# Patient Record
Sex: Female | Born: 1980 | Race: Black or African American | Hispanic: No | Marital: Single | Smoking: Never smoker
Health system: Southern US, Community
[De-identification: ages and names within clinical notes are randomized; demographics above are authoritative.]

## PROBLEM LIST (undated history)

## (undated) DIAGNOSIS — E559 Vitamin D deficiency, unspecified: Secondary | ICD-10-CM

## (undated) DIAGNOSIS — M25569 Pain in unspecified knee: Secondary | ICD-10-CM

## (undated) DIAGNOSIS — F32A Depression, unspecified: Secondary | ICD-10-CM

## (undated) DIAGNOSIS — F329 Major depressive disorder, single episode, unspecified: Secondary | ICD-10-CM

## (undated) DIAGNOSIS — F419 Anxiety disorder, unspecified: Secondary | ICD-10-CM

## (undated) HISTORY — DX: Depression, unspecified: F32.A

## (undated) HISTORY — DX: Major depressive disorder, single episode, unspecified: F32.9

## (undated) HISTORY — DX: Morbid (severe) obesity due to excess calories: E66.01

## (undated) HISTORY — DX: Anxiety disorder, unspecified: F41.9

## (undated) HISTORY — DX: Pain in unspecified knee: M25.569

## (undated) HISTORY — DX: Vitamin D deficiency, unspecified: E55.9

---

## 2004-10-02 ENCOUNTER — Emergency Department: Payer: Self-pay | Admitting: Emergency Medicine

## 2007-12-04 ENCOUNTER — Ambulatory Visit: Payer: Self-pay | Admitting: Family Medicine

## 2008-06-22 ENCOUNTER — Emergency Department: Payer: Self-pay | Admitting: Emergency Medicine

## 2008-12-30 ENCOUNTER — Ambulatory Visit: Payer: Self-pay | Admitting: Family Medicine

## 2009-03-21 ENCOUNTER — Ambulatory Visit: Payer: Self-pay | Admitting: Urology

## 2009-04-20 ENCOUNTER — Ambulatory Visit: Payer: Self-pay | Admitting: Family Medicine

## 2009-06-08 ENCOUNTER — Ambulatory Visit: Payer: Self-pay | Admitting: Family Medicine

## 2009-06-15 ENCOUNTER — Encounter: Payer: Self-pay | Admitting: Family Medicine

## 2009-07-02 ENCOUNTER — Encounter: Payer: Self-pay | Admitting: Family Medicine

## 2009-08-02 ENCOUNTER — Encounter: Payer: Self-pay | Admitting: Family Medicine

## 2009-12-13 DIAGNOSIS — R319 Hematuria, unspecified: Secondary | ICD-10-CM

## 2010-05-21 ENCOUNTER — Emergency Department: Payer: Self-pay | Admitting: Emergency Medicine

## 2010-05-26 IMAGING — CR DG KNEE 1-2V*R*
1 series · 2 of 2 positions shown · non-contrast
Comparison: none

REASON FOR EXAM: pain/weight bearing
COMMENTS:

PROCEDURE:     KDR - KDXR KNEE RIGHT AP AND LATERAL  - April 20, 2009  [DATE]
RESULT:     No fracture, dislocation or other acute bony abnormality is
identified. The knee joint space is well maintained. The patella is intact.

[Series 2: view not recorded · 0.17mm/px · 2 of 2 slices shown]
[im 1/2]
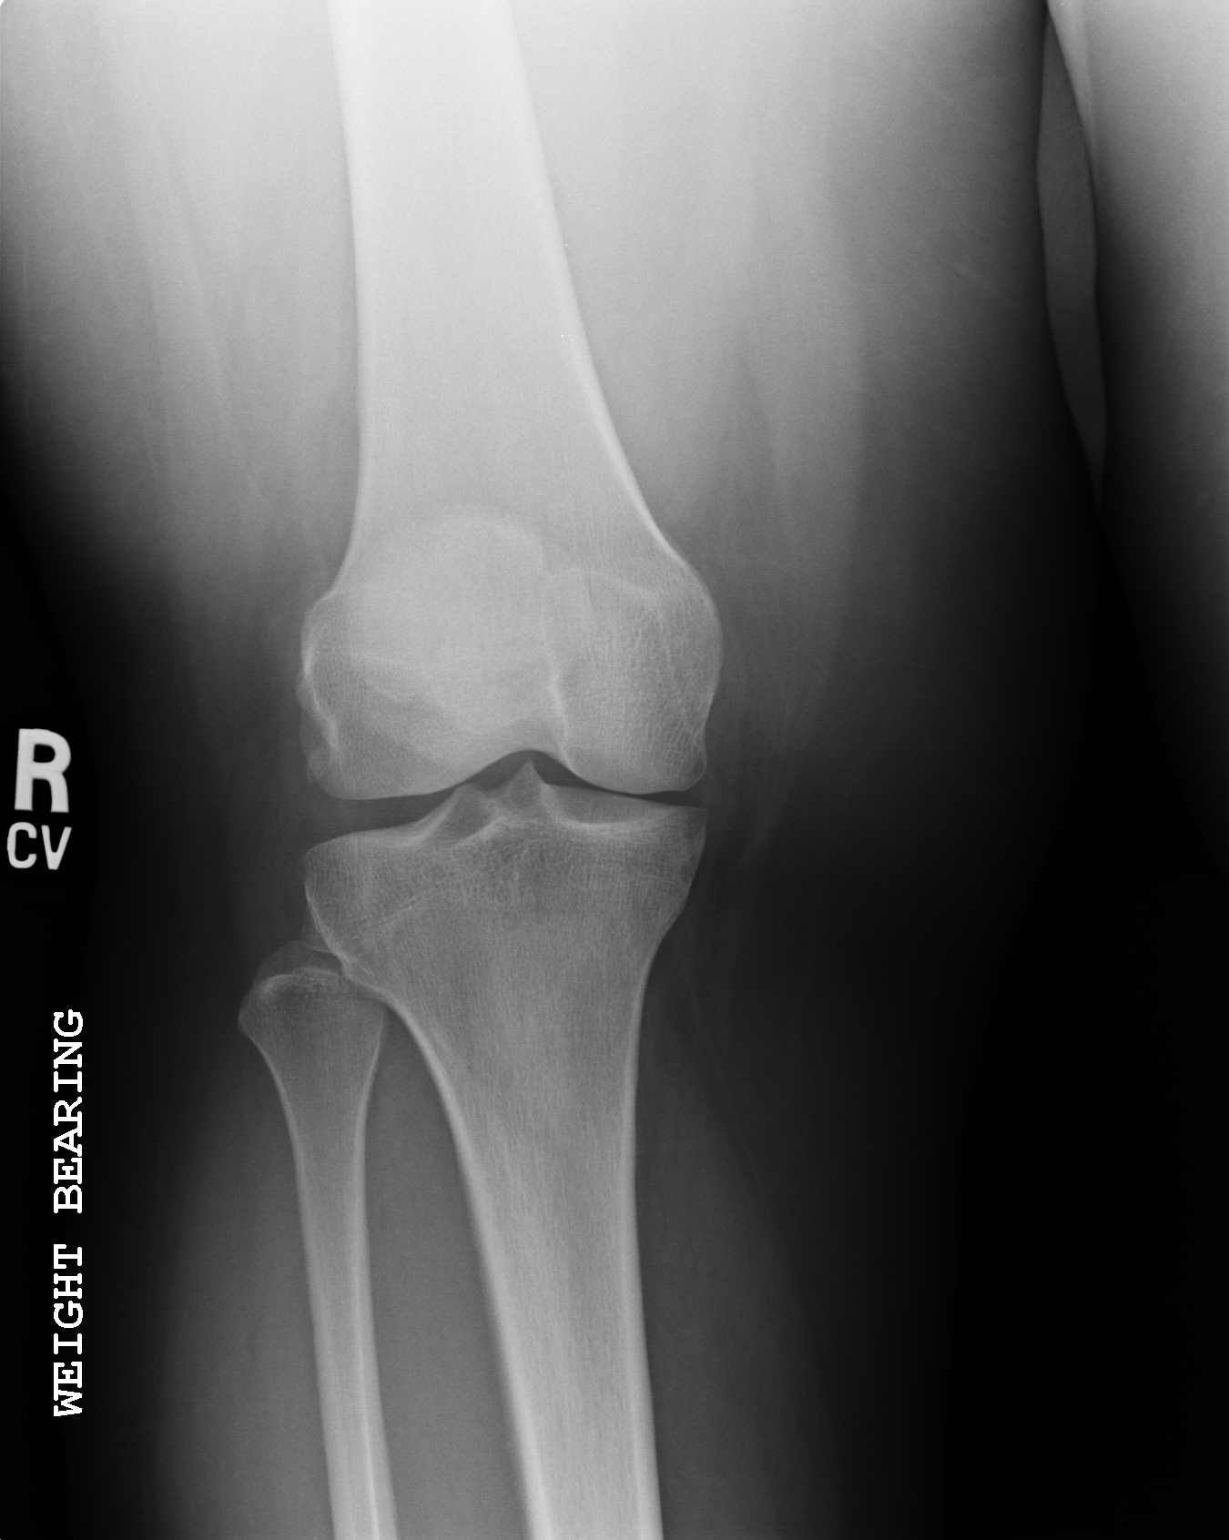
[im 2/2]
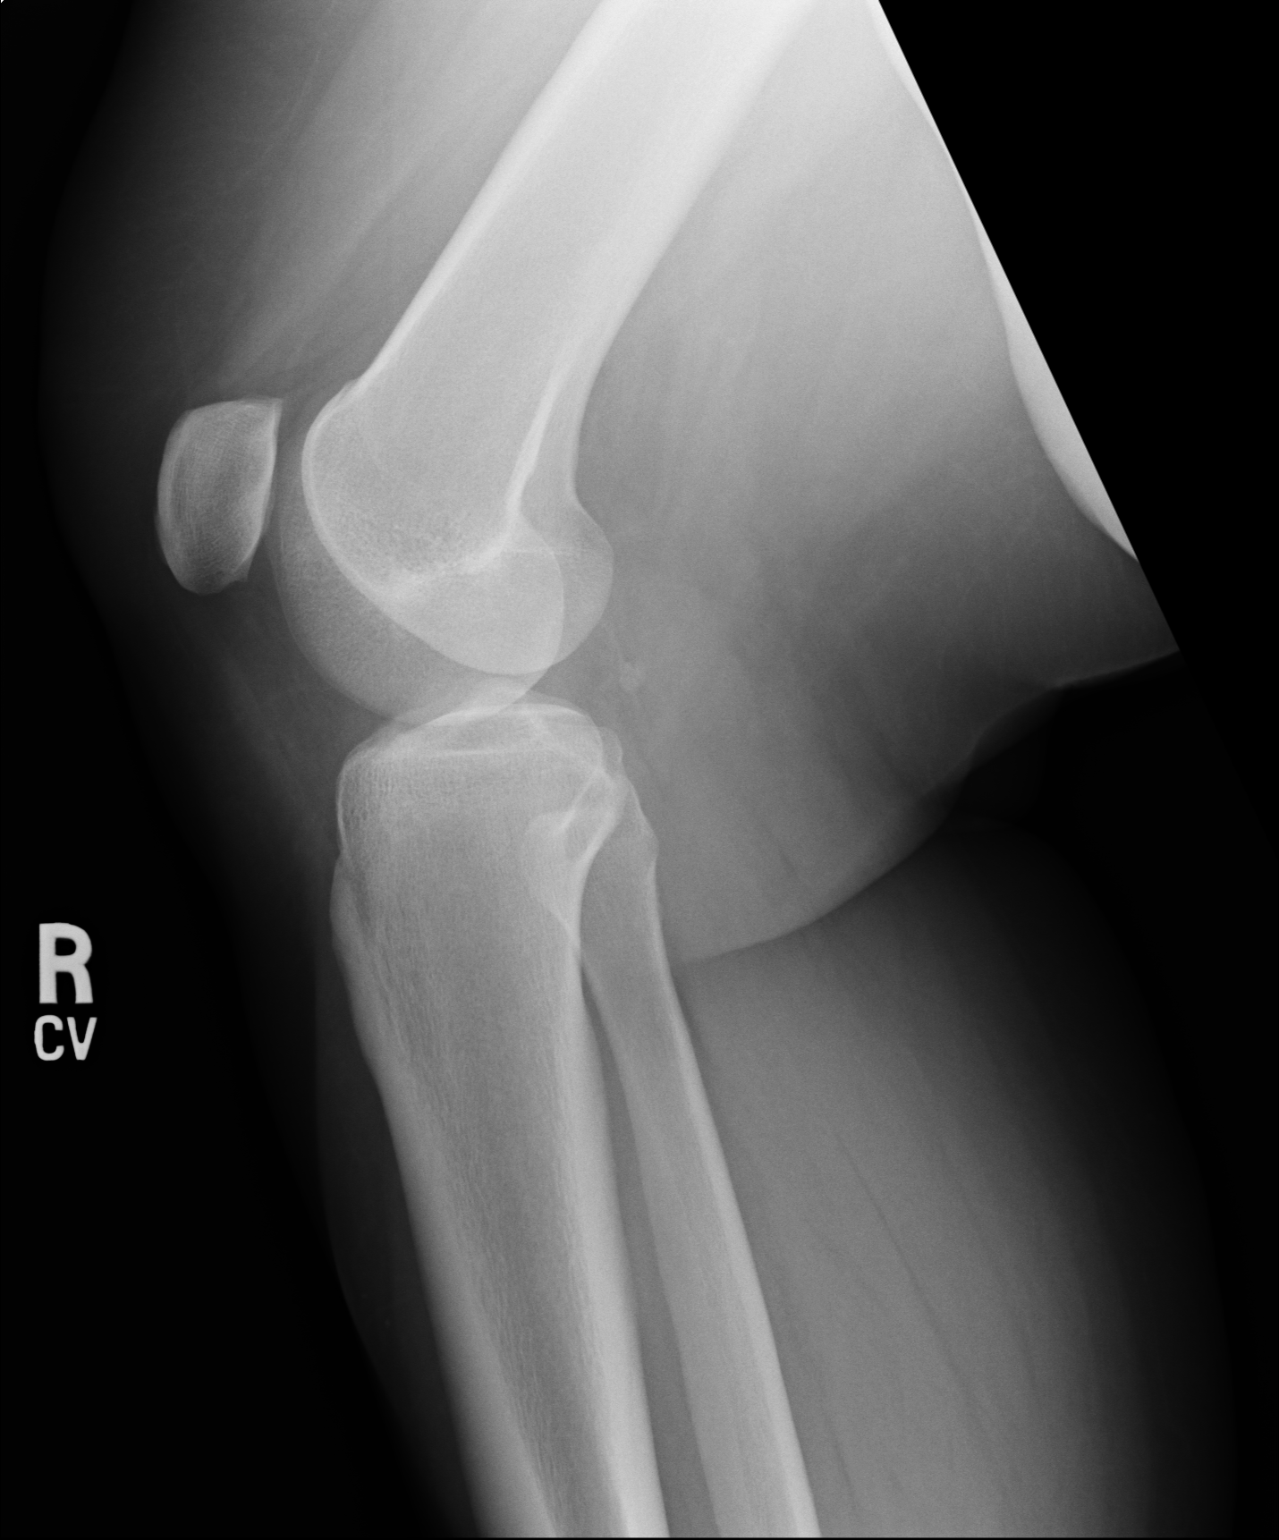

[2 of 2 positions shown; findings below may reference images not displayed]

IMPRESSION: No acute changes are identified.

## 2010-07-14 IMAGING — US TRANSABDOMINAL ULTRASOUND OF PELVIS
1 series · 17 of 25 positions shown · non-contrast
Comparison: none

REASON FOR EXAM: suprapubic pelvic pain
COMMENTS:

[Series 1: transabdominal ultrasound of pelvis · 17 of 59 slices shown]
[im 1/59]
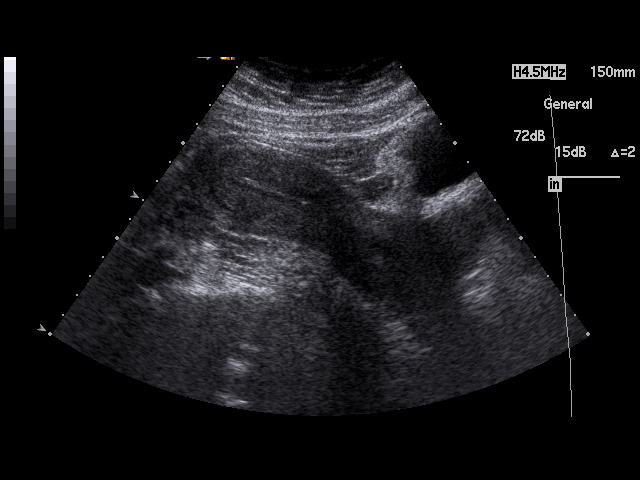
[im 5/59]
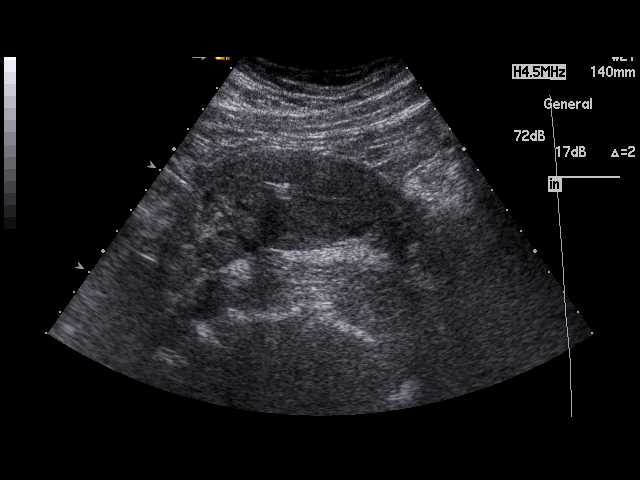
[im 8/59]
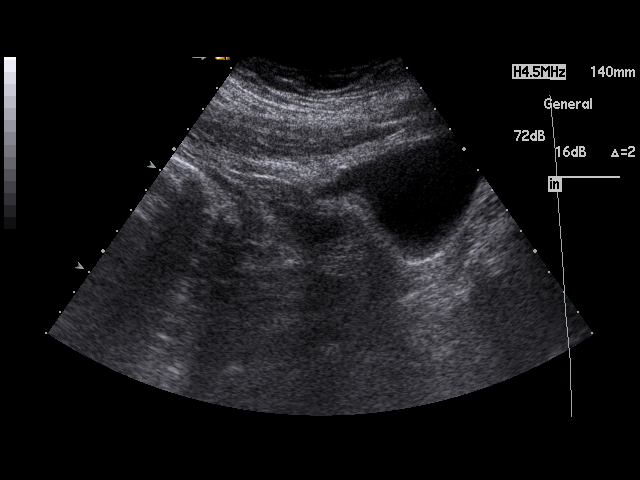
[im 13/59]
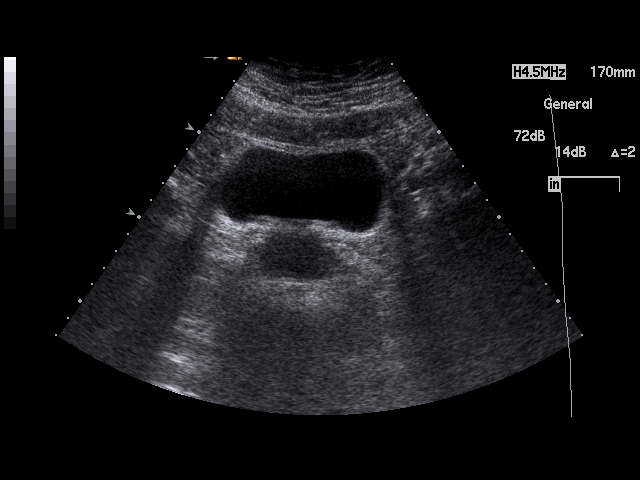
[im 15/59]
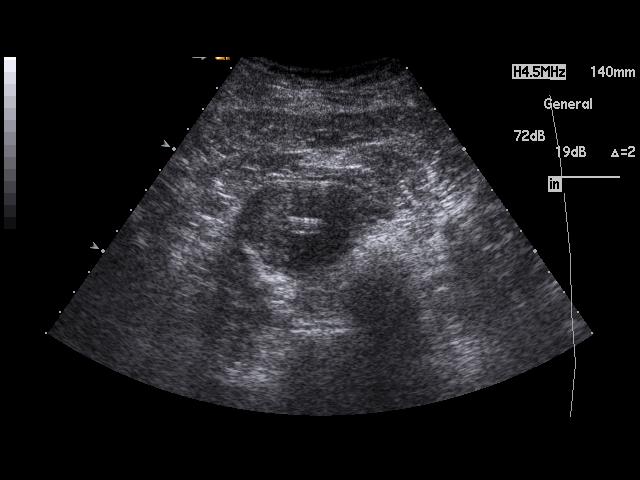
[im 20/59]
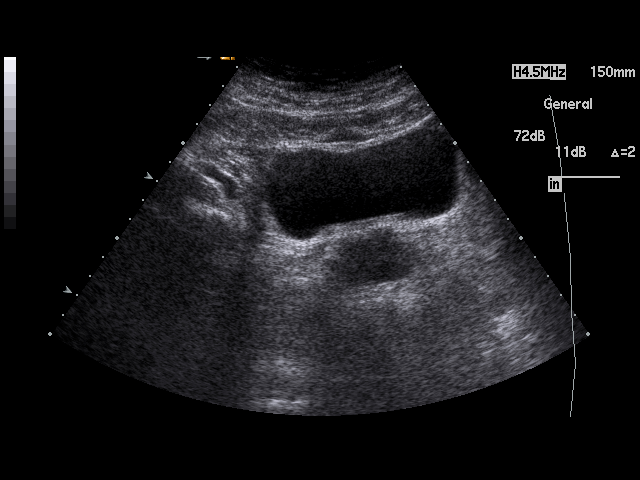
[im 22/59]
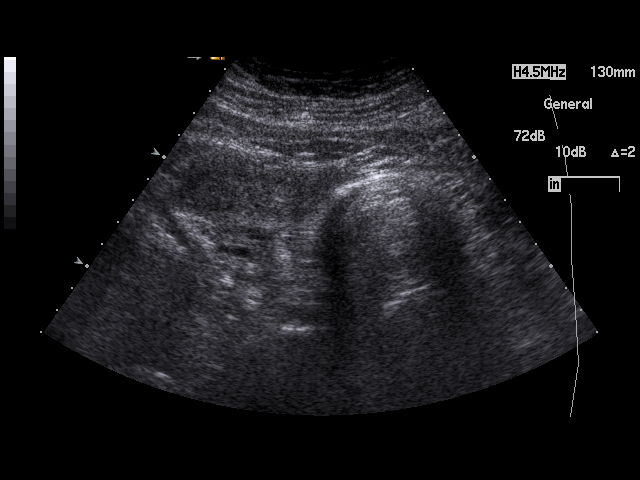
[im 27/59]
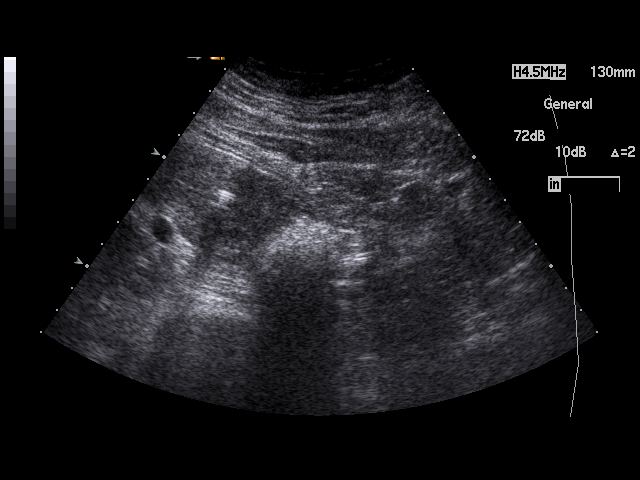
[im 30/59]
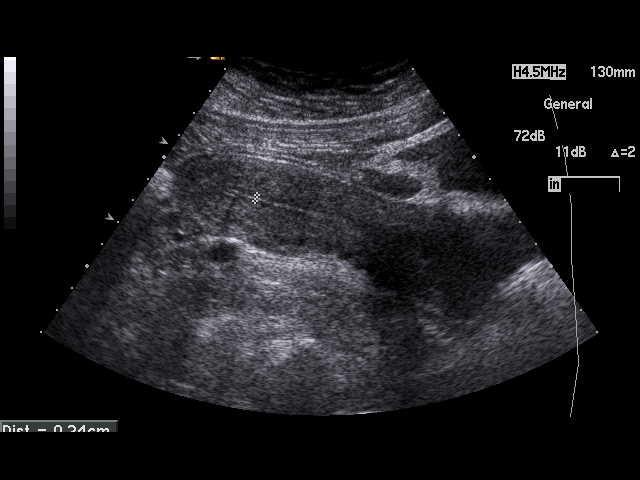
[im 32/59]
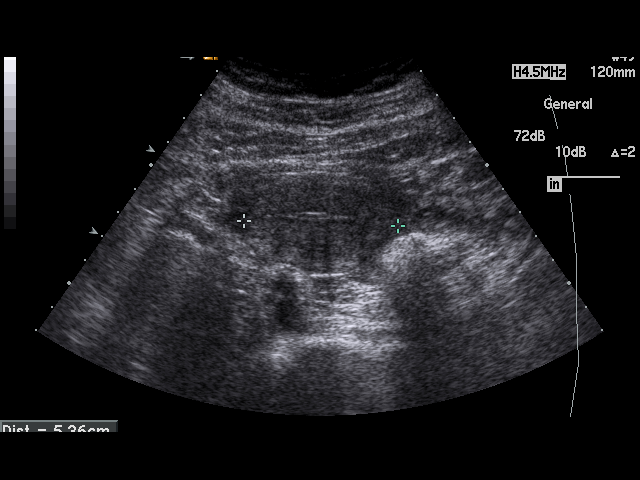
[im 37/59]
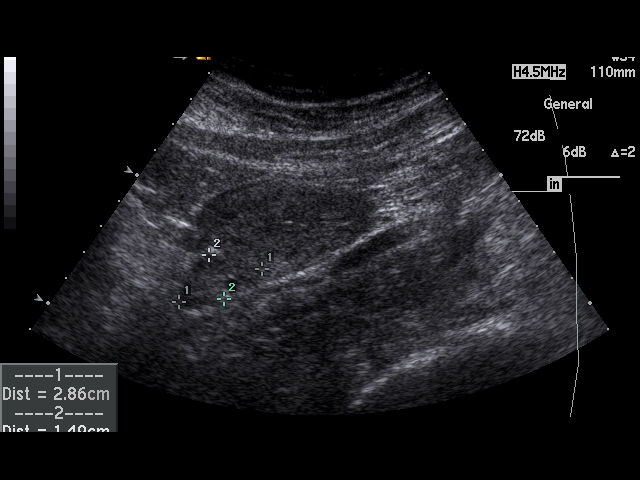
[im 39/59]
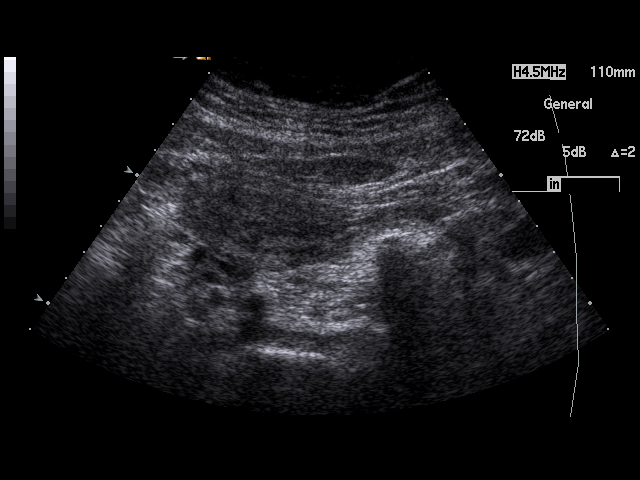
[im 44/59]
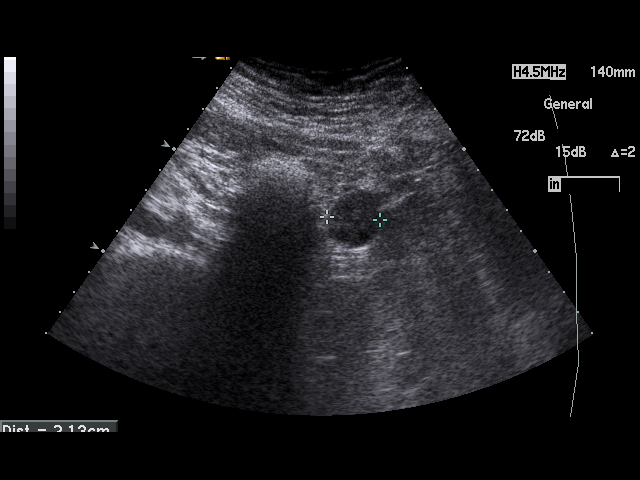
[im 46/59]
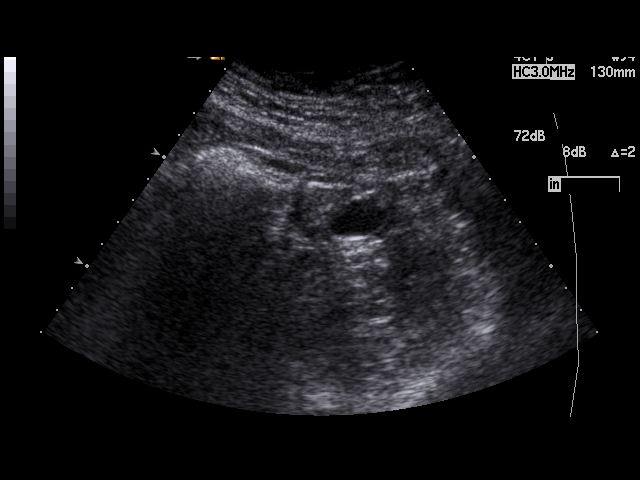
[im 51/59]
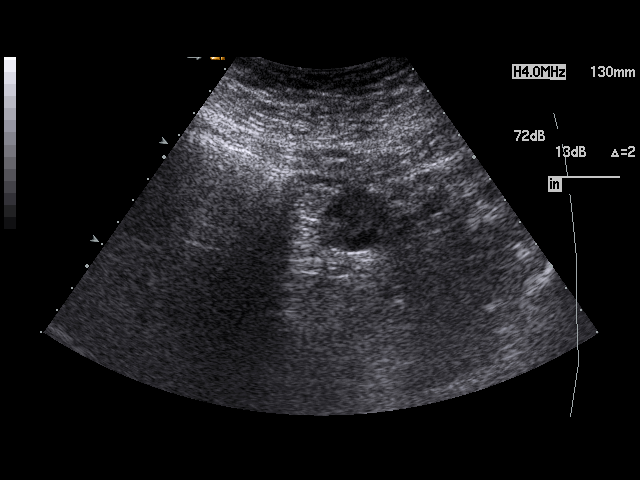
[im 54/59]
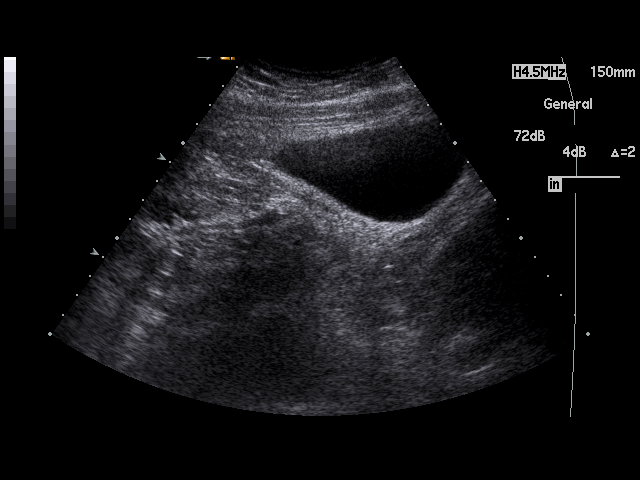
[im 59/59]
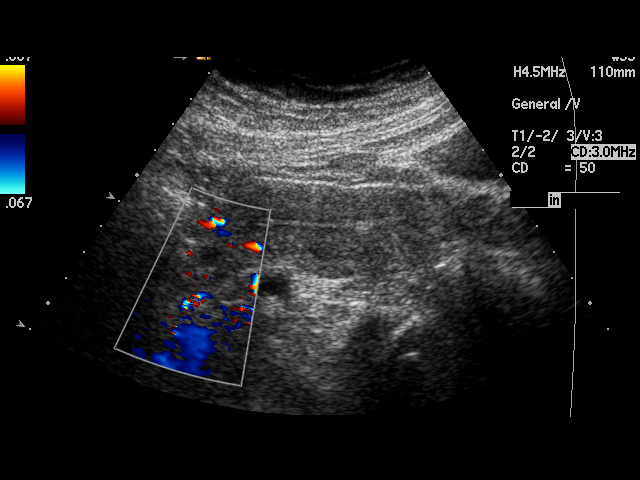

[17 of 25 positions shown; findings below may reference images not displayed]

PROCEDURE:     US  - US PELVIS EXAM  - June 08, 2009  [DATE]

RESULT:     Transabdominal pelvic ultrasound was performed. The uterus
measures 13.22 cm x 5.36 cm x 3.49 cm. No uterine mass lesions are seen. The
endometrium measures 2.4 mm in thickness. An IUD is present within the
endometrial cavity and appears to be in good position. The right and left
ovaries are visualized. The right ovary measures 2.86 cm at maximum diameter
and the left ovary measures 4.46 cm at maximum diameter. There is a 2.47 cm
cyst of the left ovary. No free fluid is identified in the pelvis. The
visualized portion of the urinary bladder is normal in appearance.
IMPRESSION: 1. There is simple cyst to the left ovary. Otherwise normal study.
2. An IUD is present.

## 2010-07-14 IMAGING — US ABDOMEN ULTRASOUND
1 series · 17 of 25 positions shown · non-contrast
Comparison: none

REASON FOR EXAM: RUQ
COMMENTS:

[Series 1: abdomen ultrasound · 17 of 54 slices shown]
[im 1/54]
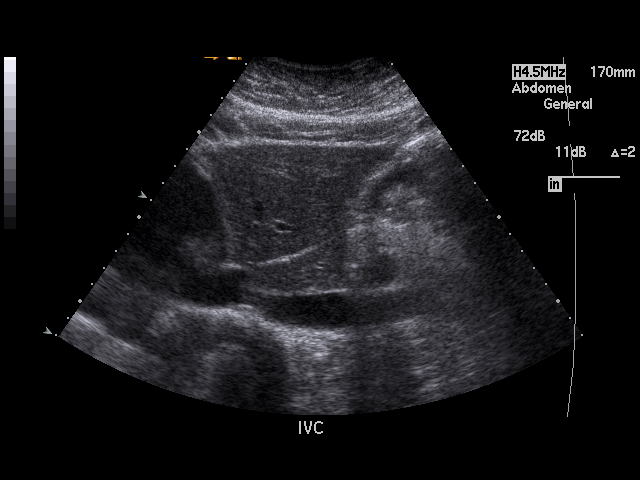
[im 5/54]
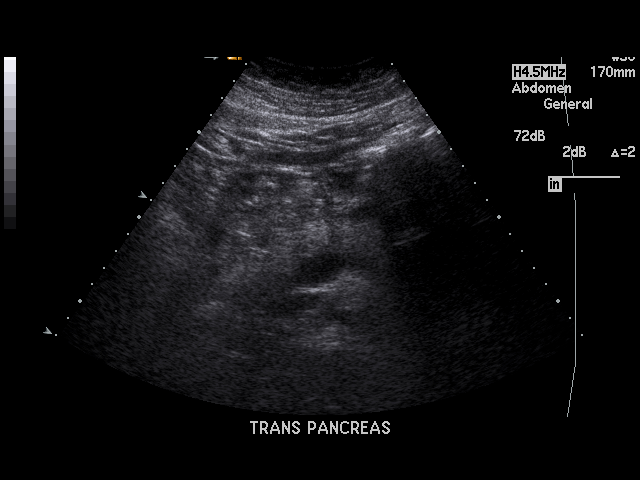
[im 7/54]
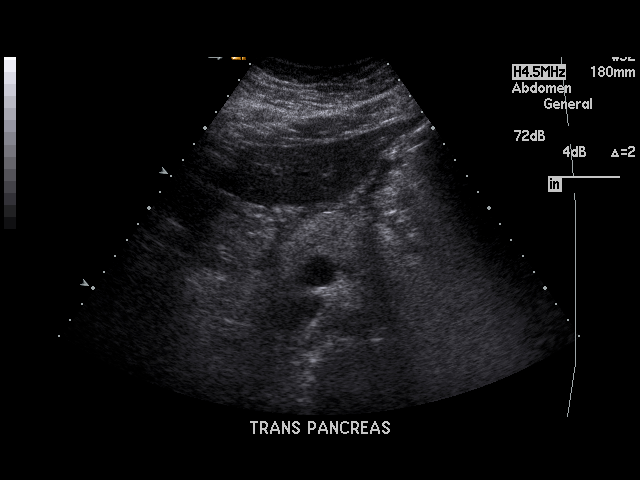
[im 12/54]
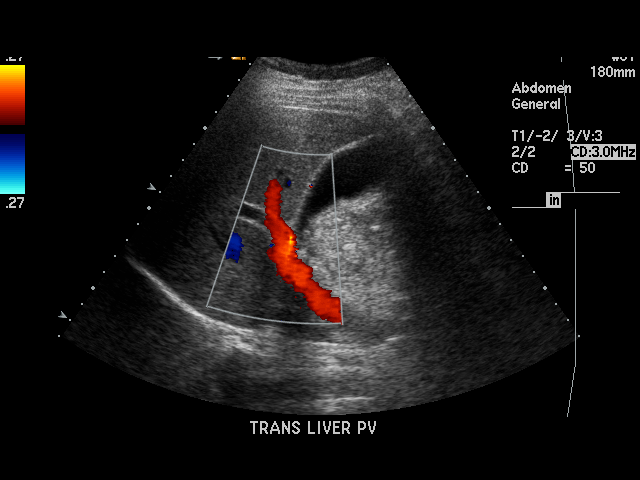
[im 14/54]
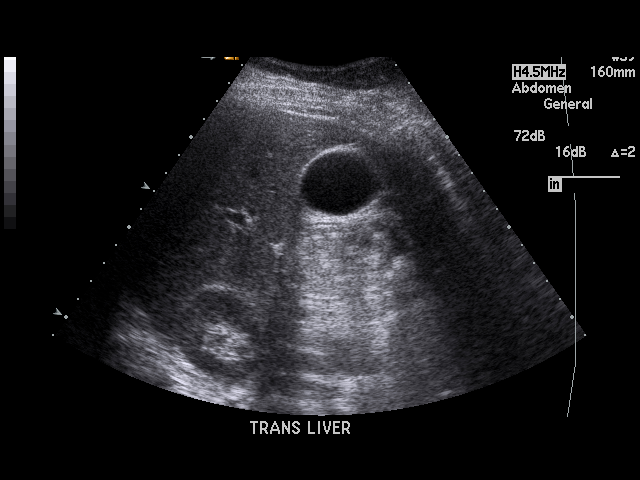
[im 18/54]
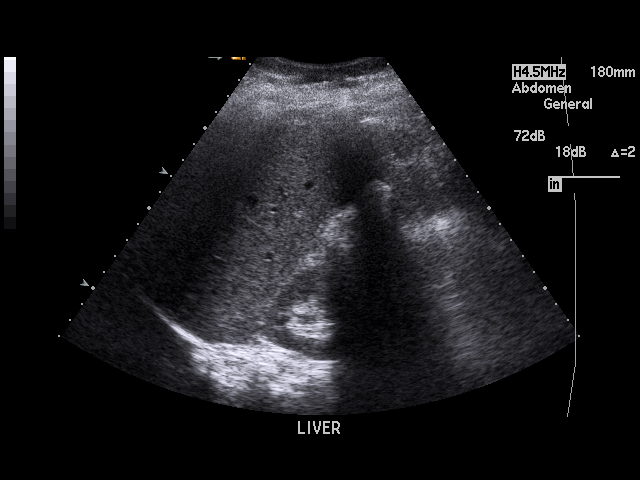
[im 20/54]
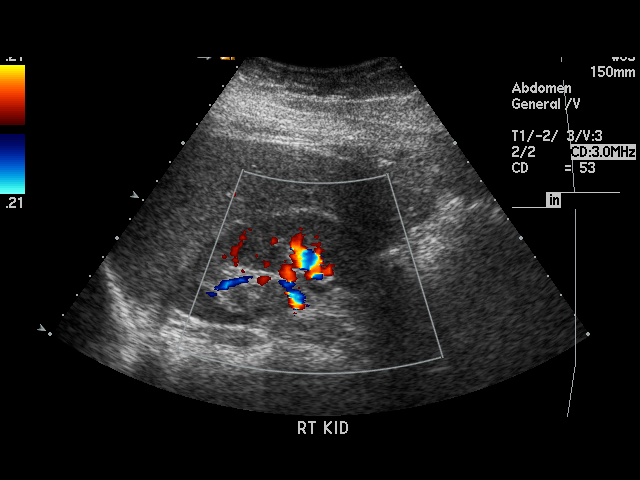
[im 25/54]
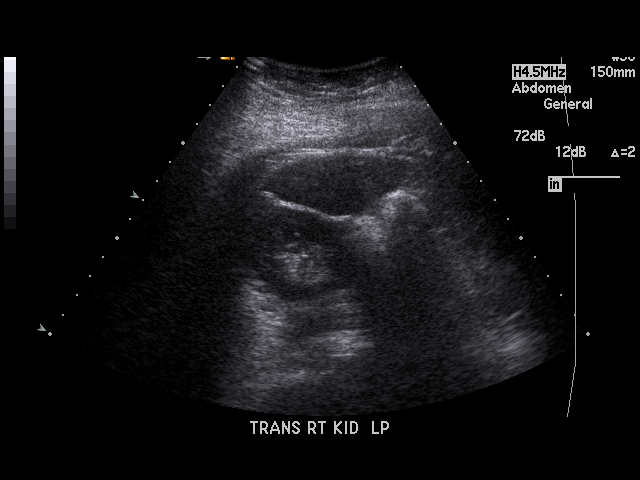
[im 27/54]
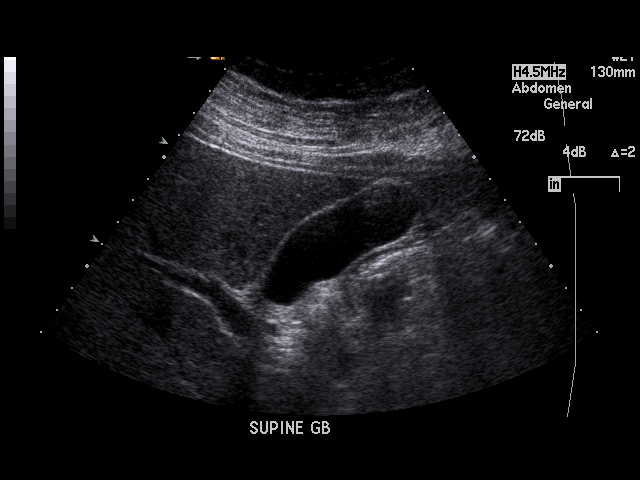
[im 29/54]
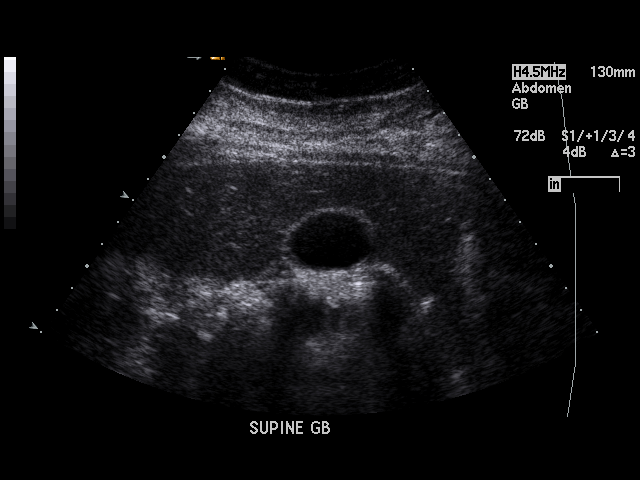
[im 34/54]
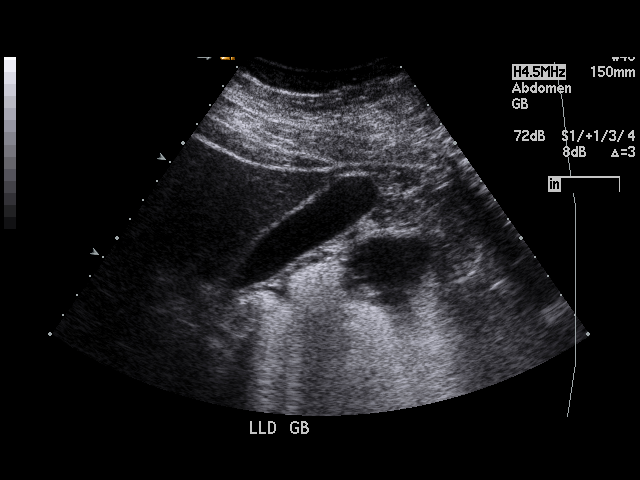
[im 36/54]
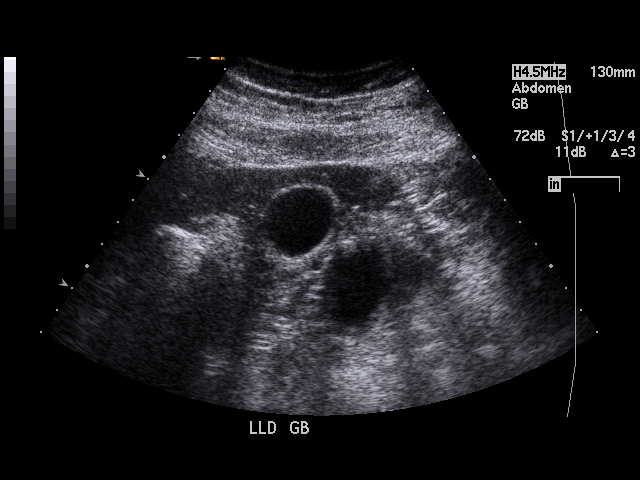
[im 40/54]
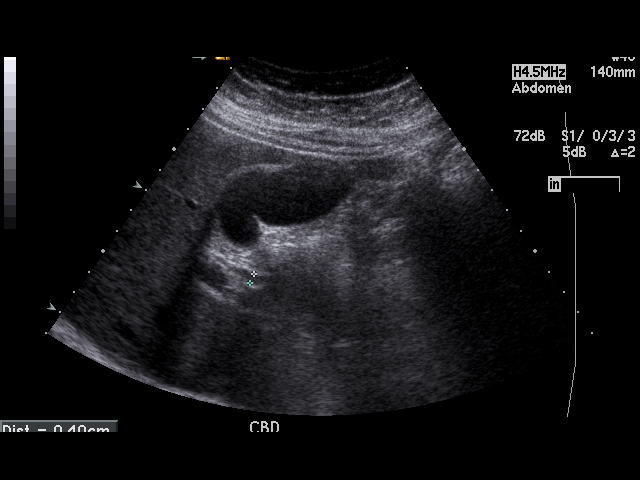
[im 42/54]
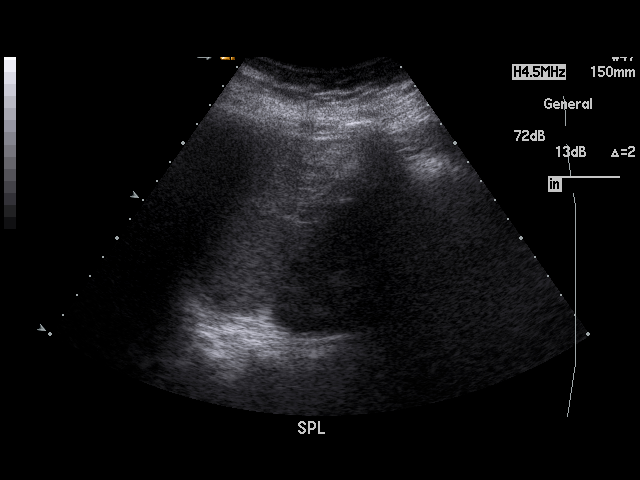
[im 47/54]
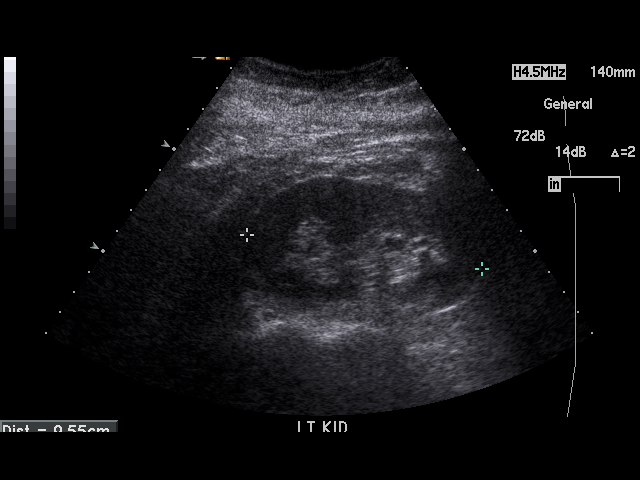
[im 49/54]
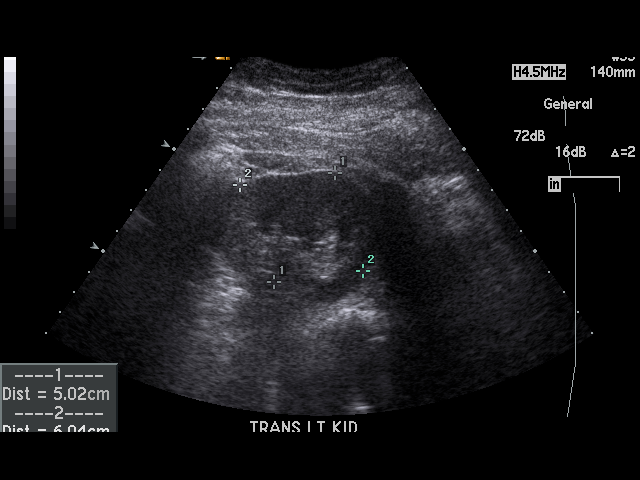
[im 54/54]
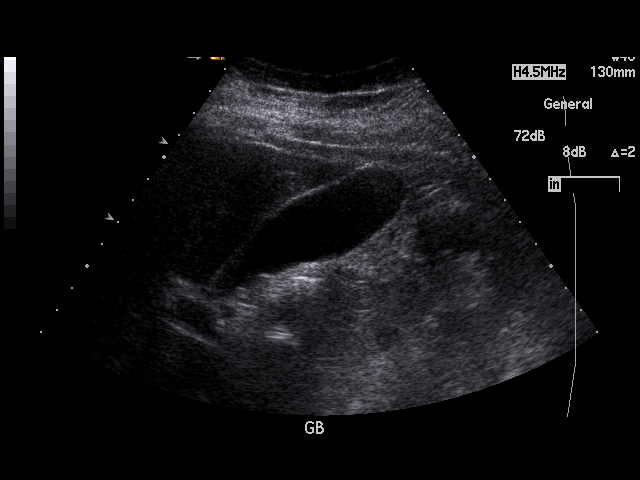

[17 of 25 positions shown; findings below may reference images not displayed]

PROCEDURE:     US  - US ABDOMEN GENERAL SURVEY  - June 08, 2009  [DATE]

RESULT:     The body of the pancreas is normal in appearance. The pancreatic
tail and head are partially obscured by bowel gas. The liver, spleen,
abdominal aorta and inferior vena cava show no significant abnormalities. No
gallstones are seen. There is no thickening of the gallbladder wall. The
common bile duct measures 4 mm in diameter which is within normal limits.
The kidneys show no hydronephrosis. There is no ascites.
IMPRESSION: 1. No significant abnormalities are noted.
2. The pancreas is partially obscured by bowel gas.

## 2014-08-22 ENCOUNTER — Encounter: Payer: Self-pay | Admitting: Family Medicine

## 2014-08-22 DIAGNOSIS — B009 Herpesviral infection, unspecified: Secondary | ICD-10-CM | POA: Insufficient documentation

## 2014-08-22 DIAGNOSIS — M549 Dorsalgia, unspecified: Secondary | ICD-10-CM

## 2014-08-22 DIAGNOSIS — F339 Major depressive disorder, recurrent, unspecified: Secondary | ICD-10-CM | POA: Insufficient documentation

## 2014-08-22 DIAGNOSIS — G4700 Insomnia, unspecified: Secondary | ICD-10-CM | POA: Insufficient documentation

## 2014-08-22 DIAGNOSIS — E668 Other obesity: Secondary | ICD-10-CM | POA: Insufficient documentation

## 2014-08-22 DIAGNOSIS — J309 Allergic rhinitis, unspecified: Secondary | ICD-10-CM | POA: Insufficient documentation

## 2014-08-22 DIAGNOSIS — K219 Gastro-esophageal reflux disease without esophagitis: Secondary | ICD-10-CM | POA: Insufficient documentation

## 2014-08-22 DIAGNOSIS — K5900 Constipation, unspecified: Secondary | ICD-10-CM | POA: Insufficient documentation

## 2014-08-22 DIAGNOSIS — G8929 Other chronic pain: Secondary | ICD-10-CM | POA: Insufficient documentation

## 2014-08-22 DIAGNOSIS — M25569 Pain in unspecified knee: Secondary | ICD-10-CM | POA: Insufficient documentation

## 2014-08-22 DIAGNOSIS — Z975 Presence of (intrauterine) contraceptive device: Secondary | ICD-10-CM | POA: Insufficient documentation

## 2014-08-25 ENCOUNTER — Encounter (INDEPENDENT_AMBULATORY_CARE_PROVIDER_SITE_OTHER): Payer: Self-pay

## 2014-08-25 ENCOUNTER — Ambulatory Visit (INDEPENDENT_AMBULATORY_CARE_PROVIDER_SITE_OTHER): Payer: BLUE CROSS/BLUE SHIELD | Admitting: Family Medicine

## 2014-08-25 ENCOUNTER — Encounter: Payer: Self-pay | Admitting: Family Medicine

## 2014-08-25 VITALS — BP 100/62 | HR 92 | Temp 98.3°F | Resp 18 | Ht 65.0 in | Wt 278.0 lb

## 2014-08-25 DIAGNOSIS — M545 Low back pain, unspecified: Secondary | ICD-10-CM

## 2014-08-25 DIAGNOSIS — G4700 Insomnia, unspecified: Secondary | ICD-10-CM | POA: Diagnosis not present

## 2014-08-25 DIAGNOSIS — F339 Major depressive disorder, recurrent, unspecified: Secondary | ICD-10-CM | POA: Diagnosis not present

## 2014-08-25 DIAGNOSIS — E668 Other obesity: Secondary | ICD-10-CM

## 2014-08-25 DIAGNOSIS — E669 Obesity, unspecified: Secondary | ICD-10-CM

## 2014-08-25 MED ORDER — SERTRALINE HCL 50 MG PO TABS: 100.0000 mg | ORAL_TABLET | ORAL | Status: DC

## 2014-08-25 MED ORDER — QUETIAPINE FUMARATE 25 MG PO TABS: 25.0000 mg | ORAL_TABLET | ORAL | Status: DC

## 2014-08-25 MED ORDER — ZOLPIDEM TARTRATE ER 12.5 MG PO TBCR: 12.5000 mg | EXTENDED_RELEASE_TABLET | Freq: Every day | ORAL | Status: DC

## 2014-08-25 MED ORDER — METAXALONE 800 MG PO TABS: 800.0000 mg | ORAL_TABLET | Freq: Three times a day (TID) | ORAL | Status: DC

## 2014-08-25 MED ORDER — BUPROPION HCL ER (XL) 150 MG PO TB24: 150.0000 mg | ORAL_TABLET | Freq: Every day | ORAL | Status: DC

## 2014-08-25 NOTE — Patient Instructions (Signed)
    GET HELP  Contact a suicide hotline, crisis center, or local suicide prevention center for help right away. Local centers may include a hospital, clinic, community service organization, social service provider, or health department.  Call your local emergency services (911 in the [place]).  Call a suicide hotline:  1-800-273-TALK (1-800-273-8255) in the [place].  1-800-SUICIDE (1-800-784-2433) in the [place].  1-888-628-9454 in the [place] for Spanish-speaking counselors.  1-800-799-4TTY (1-800-799-4889) in the [place] for TTY users.  Visit the following websites for information and help:  National Suicide Prevention Lifeline: www.suicidepreventionlifeline.org  Hopeline: www.hopeline.com  American Foundation for Suicide Prevention: www.afsp.org  For lesbian, gay, bisexual, transgender, or questioning youth, contact The [facility]:  1-866-4-U-TREVOR (1-866-488-7386) in the [place].  www.thetrevorproject.org  In [place], treatment resources are listed in each province with listings available under [employer] or similar titles. Another source for Crisis Centres by Province is located at http://www.suicideprevention.ca/in-crisis-now/find-a-crisis-centre-now/crisis-centres Document Released: 08/25/2002 Document Revised: 05/13/2011 Document Reviewed: 10/27/2007 ExitCare Patient Information 2015 [employer]. This information is not intended to replace advice given to you by your health care provider. Make sure you discuss any questions you have with your health care provider.  

## 2014-08-25 NOTE — Progress Notes (Signed)
Name: Kaitlyn Greene   MRN: [id number]    DOB: [birth year]   Date:08/25/2014       Progress Note  Subjective  Chief Complaint  Chief Complaint  Patient presents with  . Depression    stopped brintellix to many side effects, unchanged pt states has increased appetite  . Insomnia    improving    HPI  Major depressive disorder: she states she has been depressed since childhood, but diagnosed at age 34yo. She has seen a psychiatrist in the past, she never had counseling.  Brintelix caused nausea and vomiting, also tried Celexa and Cymbalta without success. Currently on seroquel and Zoloft.  She continues to have anhedonia, she went from not eating to overeating . Feels tired, but denies drowsiness.  She has  suicidal thoughts but no plans and states she would not do it because of her kids. She has also been having panic attacks about 5 times weekly    Insomnia: doing better since she has been taking Seroquel at 6pm and Ambien at bedtime.  Chronic low back pain:pain is constant on lumbar spine, at this time is 6/10. Usually dull, but has a sharp pain intermittently. She has seen PT/chiropractor and massage therapy without success. Taking ibuprofen prn and would like to go back on muscle relaxer  Obesity: she has gained weight since last visit 7 lbs, she states she has been eating more than usual. It may be side effect of medication.  Patient Active Problem List   Diagnosis Date Noted  . Back pain, chronic 08/22/2014  . Insomnia, persistent 08/22/2014  . CN (constipation) 08/22/2014  . Gastro-esophageal reflux disease without esophagitis 08/22/2014  . Herpes simplex type 2 infection 08/22/2014  . Presence of intrauterine contraceptive device 08/22/2014  . Chronic recurrent major depressive disorder 08/22/2014  . Extreme obesity 08/22/2014  . Knee pain 08/22/2014  . Allergic rhinitis 08/22/2014  . Hematuria 12/13/2009    History reviewed. No pertinent past surgical history.  History  reviewed. No pertinent family history.  History   Social History  . Marital Status: Single    Spouse Name: N/A  . Number of Children: N/A  . Years of Education: N/A   Occupational History  . Not on file.   Social History Main Topics  . Smoking status: Never Smoker   . Smokeless tobacco: Never Used  . Alcohol Use: No  . Drug Use: No  . Sexual Activity: Not Currently   Other Topics Concern  . Not on file   Social History Narrative  . No narrative on file     Current outpatient prescriptions:  .  Cholecalciferol (VITAMIN D) 2000 UNITS CAPS, Take 1 tablet by mouth daily., Disp: , Rfl:  .  ibuprofen (ADVIL,MOTRIN) 800 MG tablet, Take 1 tablet by mouth as needed., Disp: , Rfl: 0 .  levonorgestrel (MIRENA, 52 MG,) 20 MCG/24HR IUD, by Intrauterine route., Disp: , Rfl:  .  QUEtiapine (SEROQUEL) 25 MG tablet, Take 1 tablet (25 mg total) by mouth 1 day or 1 dose., Disp: 30 tablet, Rfl: 2 .  sertraline (ZOLOFT) 50 MG tablet, Take 2 tablets (100 mg total) by mouth 1 day or 1 dose., Disp: 30 tablet, Rfl: 2 .  zolpidem (AMBIEN CR) 12.5 MG CR tablet, Take 1 tablet (12.5 mg total) by mouth at bedtime., Disp: 30 tablet, Rfl: 2  Not on File   ROS  Constitutional: Negative for fever but has weight change.  Respiratory: Negative for cough and shortness of breath.  Cardiovascular: Negative for chest pain or palpitations.  Gastrointestinal: Negative for abdominal pain, no bowel changes.  Musculoskeletal: Negative for gait problem or joint swelling. low back pain  Skin: Negative for rash.  Neurological: Negative for dizziness or headache.  No other specific complaints in a complete review of systems (except as listed in HPI above).  Objective  Filed Vitals:   08/25/14 1013  BP: 100/62  Pulse: 92  Temp: 98.3 F (36.8 C)  TempSrc: Oral  Resp: 18  Height: 5\' 5"  (1.651 m)  Weight: 278 lb (126.1 kg)  SpO2: 99%    Body mass index is 46.26 kg/(m^2).  Physical  Exam   Constitutional: Patient appears well-developed and well-nourished. No distress. Obese HENT: Head: Normocephalic and atraumatic. Nose: Nose normal. Mouth/Throat: Oropharynx is clear and moist. No oropharyngeal exudate.  Eyes: Conjunctivae and EOM are normal. Pupils are equal, round, and reactive to light. No scleral icterus.  Neck: Normal range of motion. Neck supple. No JVD present. No thyromegaly present.  Cardiovascular: Normal rate, regular rhythm and normal heart sounds.  No murmur heard. No BLE edema. Pulmonary/Chest: Effort normal and breath sounds normal. No respiratory distress. Abdominal: Soft. Bowel sounds are normal, no distension. There is no tenderness. no masses Musculoskeletal:decrease  range of motion of spine, no joint effusions. No gross deformities She has pain during palpation of lumbar spine, neg straight leg raise.  Neurological: he is alert and oriented to person, place, and time. No cranial nerve deficit. Coordination, balance, strength, speech and gait are normal.  Skin: Skin is warm and dry. No rash noted. No erythema.  Psychiatric: Patient has a normal mood and affect. behavior is normal. Judgment and thought content normal.  PHQ2/9: Depression screen PHQ 2/9 08/25/2014  Decreased Interest 3  Down, Depressed, Hopeless 3  PHQ - 2 Score 6  Altered sleeping 0  Tired, decreased energy 3  Change in appetite 2  Feeling bad or failure about yourself  3  Trouble concentrating 3  Moving slowly or fidgety/restless 3  Suicidal thoughts 0  PHQ-9 Score 20  Difficult doing work/chores Very difficult     Fall Risk: Fall Risk  08/25/2014  Falls in the past year? Yes  Number falls in past yr: 1  Injury with Fall? No     Assessment & Plan  1. Chronic recurrent major depressive disorder Referral was made for psychiatrist in May but she never got a call, we will verify today - sertraline (ZOLOFT) 50 MG tablet; Take 2 tablets (100 mg total) by mouth 1 day or 1  dose.  Dispense: 30 tablet; Refill: 2 - QUEtiapine (SEROQUEL) 25 MG tablet; Take 1 tablet (25 mg total) by mouth 1 day or 1 dose.  Dispense: 30 tablet; Refill: 2  buPROPion (WELLBUTRIN XL) 150 MG 24 hr tablet; Take 1 tablet (150 mg total) by mouth daily.  Dispense: 30 tablet; Refill: 2  2. Extreme obesity We will add Wellbutrin   3. Insomnia, persistent Doing well at this time - zolpidem (AMBIEN CR) 12.5 MG CR tablet; Take 1 tablet (12.5 mg total) by mouth at bedtime.  Dispense: 30 tablet; Refill: 2   4. Bilateral low back pain without sciatica Resume muscle relaxer - metaxalone (SKELAXIN) 800 MG tablet; Take 1 tablet (800 mg total) by mouth 3 (three) times daily.  Dispense: 90 tablet; Refill: 0

## 2014-09-29 ENCOUNTER — Ambulatory Visit (INDEPENDENT_AMBULATORY_CARE_PROVIDER_SITE_OTHER): Payer: BLUE CROSS/BLUE SHIELD | Admitting: Psychiatry

## 2014-09-29 ENCOUNTER — Encounter: Payer: Self-pay | Admitting: Psychiatry

## 2014-09-29 VITALS — BP 122/76 | HR 82 | Temp 97.9°F | Ht 64.5 in | Wt 273.4 lb

## 2014-09-29 DIAGNOSIS — F339 Major depressive disorder, recurrent, unspecified: Secondary | ICD-10-CM | POA: Diagnosis not present

## 2014-09-29 DIAGNOSIS — F331 Major depressive disorder, recurrent, moderate: Secondary | ICD-10-CM | POA: Diagnosis not present

## 2014-09-29 MED ORDER — BUPROPION HCL ER (XL) 150 MG PO TB24: 150.0000 mg | ORAL_TABLET | Freq: Every day | ORAL | Status: DC

## 2014-09-29 MED ORDER — QUETIAPINE FUMARATE 100 MG PO TABS: 100.0000 mg | ORAL_TABLET | Freq: Every day | ORAL | Status: DC

## 2014-09-29 MED ORDER — SERTRALINE HCL 50 MG PO TABS: 50.0000 mg | ORAL_TABLET | Freq: Every day | ORAL | Status: DC

## 2014-09-29 MED ORDER — TRAZODONE HCL 50 MG PO TABS: 50.0000 mg | ORAL_TABLET | Freq: Every evening | ORAL | Status: DC | PRN

## 2014-09-29 NOTE — Progress Notes (Signed)
Psychiatric Initial Adult Assessment   Patient Identification: Kaitlyn Greene MRN:  [id number] Date of Evaluation:  09/29/2014 Referral Source: [facility] Chief Complaint:   Chief Complaint    Establish Care; Fatigue; Anxiety; Depression; Panic Attack; Stress     Visit Diagnosis:    ICD-9-CM ICD-10-CM   1. MDD (major depressive disorder), recurrent episode, moderate 296.32 F33.1    Diagnosis:   Patient Active Problem List   Diagnosis Date Noted  . Back pain, chronic [M54.9, G89.29] 08/22/2014  . Insomnia, persistent [G47.00] 08/22/2014  . CN (constipation) [K59.00] 08/22/2014  . Gastro-esophageal reflux disease without esophagitis [K21.9] 08/22/2014  . Herpes simplex type 2 infection [B00.9] 08/22/2014  . Presence of intrauterine contraceptive device [Z97.5] 08/22/2014  . Chronic recurrent major depressive disorder [F33.9] 08/22/2014  . Extreme obesity [E66.01] 08/22/2014  . Knee pain [M25.569] 08/22/2014  . Allergic rhinitis [J30.9] 08/22/2014  . Hematuria [R31.9] 12/13/2009   History of Present Illness:    Patient is a 34 year old single African-American female who presented for initial assessment. She was referred by [facility] at her primary care physician's office Dr. Carlynn Purl. She reported that she has been feeling depressed, hopeless helpless and has been having crying spells and not improving on her current medications which are prescribed by her PCP. She has been having issues with her children who are currently 76 and a set of twins at 35 year old. She reported that she does not have any social support and feels that her 40 year old daughter is not listening to her. Patient reported that she is too busy at her work and does not spend much time with her children. She was tearful during the interview. She reported that she is currently taking Wellbutrin, sertraline, Seroquel and Ambien at night to help her sleep. Patient reported that her 68 year old daughter has  started using marijuana as her daughter's father told her who found out through  the snapchat.  She reported that she is concerned about her behavioral issues as well. She wants help but there is no family members around who can help her. She also feels that she wanted to be in a relationship but she does not have any steady relationship  at this time. She stated that she tries to be helpful to the children and her 24 year old are better than her 94 year old daughter who has been showing her age-related issues. Patient currently denied having any suicidal ideations or plans. She feels that her mind is racing all the time and sometimes she also shows some anger. She feels frustrated most of the time. She denied using any drugs or alcohol at this time.  She is willing to have her medications adjusted and also start doing therapy.   Elements:  Location:  acute. Associated Signs/Symptoms: Depression Symptoms:  depressed mood, anhedonia, psychomotor retardation, fatigue, difficulty concentrating, loss of energy/fatigue, (Hypo) Manic Symptoms:  Flight of Ideas, agitated Anxiety Symptoms:  Excessive Worry, Psychotic Symptoms:  none PTSD Symptoms: Negative NA  Past Medical History:  Past Medical History  Diagnosis Date  . Depression   . Anxiety   . Obesities, morbid   . Knee pain   . Vitamin D deficiency    History reviewed. No pertinent past surgical history. Family History:  Family History  Problem Relation Age of Onset  . Hypertension Mother   . Hyperlipidemia Mother   . COPD Mother   . Depression Mother   . Diabetes Father   . COPD Father   . Depression Sister   . Hypertension Brother  Social History:   History   Social History  . Marital Status: Single    Spouse Name: N/A  . Number of Children: N/A  . Years of Education: N/A   Social History Main Topics  . Smoking status: Never Smoker   . Smokeless tobacco: Never Used  . Alcohol Use: No  . Drug Use: No  . Sexual  Activity: Not Currently   Other Topics Concern  . None   Social History Narrative   Additional Social History:    Patient is a single mother of 3 children. 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 She reported that she does not have any use of drugs or alcohol. She was never married and has children from 2 different relationships.  Musculoskeletal: Strength & Muscle Tone: within normal limits Gait & Station: normal Patient leans: N/A  Psychiatric Specialty Exam: HPI  Review of Systems  Musculoskeletal: Positive for back pain.  Neurological: Positive for headaches.  Psychiatric/Behavioral: Positive for depression. The patient is nervous/anxious and has insomnia.   All other systems reviewed and are negative.   Blood pressure 122/76, pulse 82, temperature 97.9 F (36.6 C), temperature source Tympanic, height 5' 4.5" (1.638 m), weight 273 lb 6.4 oz (124.013 kg), last menstrual period 08/30/2014, SpO2 94 %.Body mass index is 46.22 kg/(m^2).  General Appearance: Casual  Eye Contact:  Fair  Speech:  Clear and Coherent  Volume:  Normal  Mood:  Anxious and Depressed  Affect:  Congruent and Tearful  Thought Process:  Goal Directed and Intact  Orientation:  Full (Time, Place, and Person)  Thought Content:  WDL  Suicidal Thoughts:  No  Homicidal Thoughts:  No  Memory:  Immediate;   Fair  Judgement:  Fair  Insight:  Fair  Psychomotor Activity:  Normal  Concentration:  Fair  Recall:  [employer] of Knowledge:Fair  Language: Fair  Akathisia:  No  Handed:  Right  AIMS (if indicated):    Assets:  Communication Skills Desire for Improvement Physical Health  ADL's:  Intact  Cognition: WNL  Sleep:     Is the patient at risk to self?  No. Has the patient been a risk to self in the past 6 months?  No. Has the patient been a risk to self within the distant past?  No. Is the patient a risk to others?  No. Has the patient been a risk to others in the past 6 months?   No. Has the patient been a risk to others within the distant past?  No.  Allergies:  No Known Allergies Current Medications: Current Outpatient Prescriptions  Medication Sig Dispense Refill  . buPROPion (WELLBUTRIN XL) 150 MG 24 hr tablet Take 1 tablet (150 mg total) by mouth daily. 30 tablet 2  . Cholecalciferol (VITAMIN D) 2000 UNITS CAPS Take 1 tablet by mouth daily.    Marland Kitchen ibuprofen (ADVIL,MOTRIN) 800 MG tablet Take 1 tablet by mouth as needed.  0  . levonorgestrel (MIRENA, 52 MG,) 20 MCG/24HR IUD by Intrauterine route.    . metaxalone (SKELAXIN) 800 MG tablet Take 1 tablet (800 mg total) by mouth 3 (three) times daily. 90 tablet 0  . QUEtiapine (SEROQUEL) 25 MG tablet Take 1 tablet (25 mg total) by mouth 1 day or 1 dose. 30 tablet 2  . sertraline (ZOLOFT) 50 MG tablet Take 2 tablets (100 mg total) by mouth 1 day or 1 dose. 30 tablet 2  . zolpidem (AMBIEN CR) 12.5 MG CR tablet Take 1 tablet (12.5  mg total) by mouth at bedtime. 30 tablet 2   No current facility-administered medications for this visit.    Previous Psychotropic Medications:  Patient has been tried on Celexa, brand Telex and is not taking Wellbutrin and  sertraline Seroquel and Ambien. She denied any history of suicide attempts in the past  Substance Abuse History in the last 12 months:  Yes.    Seldom use of alcohol  Consequences of Substance Abuse: Negative NA  Medical Decision Making:  Review of Psycho-Social Stressors (1)  Treatment Plan Summary: Medication management  Discussed with patient about the medications treatment risks benefits and alternatives. I will start her on Seroquel 100 mg at bedtime. She will continue on Wellbutrin XL 150 mg in the morning. Advised her to stop taking the Ambien at bedtime. She will continue on sertraline 50 mg at this time and will stop the medication in 1 month. Patient agreed with the plan  she will be also referred to the therapist in the office and she will make an  appointment at her convenience.   More than 50% of the time spent in psychoeducation, counseling and coordination of care.    This note was generated in part or whole with voice recognition software. Voice regonition is usually quite accurate but there are transcription errors that can and very often do occur. I apologize for any typographical errors that were not detected and corrected.     Brandy Hale 7/28/20169:02 AM

## 2014-10-06 ENCOUNTER — Ambulatory Visit: Payer: Self-pay | Admitting: Licensed Clinical Social Worker

## 2014-10-13 ENCOUNTER — Ambulatory Visit (INDEPENDENT_AMBULATORY_CARE_PROVIDER_SITE_OTHER): Payer: BLUE CROSS/BLUE SHIELD | Admitting: Family Medicine

## 2014-10-13 ENCOUNTER — Encounter: Payer: Self-pay | Admitting: Family Medicine

## 2014-10-13 VITALS — BP 126/84 | HR 85 | Temp 98.6°F | Resp 14 | Ht 65.0 in | Wt 279.6 lb

## 2014-10-13 DIAGNOSIS — G4700 Insomnia, unspecified: Secondary | ICD-10-CM | POA: Diagnosis not present

## 2014-10-13 DIAGNOSIS — Z23 Encounter for immunization: Secondary | ICD-10-CM

## 2014-10-13 DIAGNOSIS — F339 Major depressive disorder, recurrent, unspecified: Secondary | ICD-10-CM | POA: Diagnosis not present

## 2014-10-13 DIAGNOSIS — E668 Other obesity: Secondary | ICD-10-CM

## 2014-10-13 NOTE — Progress Notes (Signed)
Name: Kaitlyn Greene   MRN: [id number]    DOB: 05-22-1980   Date:10/13/2014       Progress Note  Subjective  Chief Complaint  Chief Complaint  Patient presents with  . Depression    unchanged  . Insomnia    HPI  Major Depression: seen by Dr. Gardiner Barefoot, and is getting Zoloft weaned off, started on Seroquel  qhs and Trazodone  prn sleep during hte night. Still on Wellbutrin XL  daily. She has not noticed an improvement in depression, feeling sluggish with new medication. States does not miss work because of depression, but has difficulty focusing while at work. She states no irritability. Denies suicidal thoughts or ideation.  Insomnia: she is off Ambien , stopped by Dr. Gardiner Barefoot, taking Seroquel and Trazodone, she is sleeping well, but wakes up feeling tired.   Obesity: not drinking sweets, mostly water, not exercising, eating usually one meal daily - dinner.   Patient Active Problem List   Diagnosis Date Noted  . Back pain, chronic 08/22/2014  . Insomnia, persistent 08/22/2014  . CN (constipation) 08/22/2014  . Gastro-esophageal reflux disease without esophagitis 08/22/2014  . Herpes simplex type 2 infection 08/22/2014  . Presence of intrauterine contraceptive device 08/22/2014  . Chronic recurrent major depressive disorder 08/22/2014  . Extreme obesity 08/22/2014  . Knee pain 08/22/2014  . Allergic rhinitis 08/22/2014  . Hematuria 12/13/2009    History reviewed. No pertinent past surgical history.  Family History  Problem Relation Age of Onset  . Hypertension Mother   . Hyperlipidemia Mother   . COPD Mother   . Depression Mother   . Diabetes Father   . COPD Father   . Depression Sister   . Hypertension Brother     Social History   Social History  . Marital Status: Single    Spouse Name: N/A  . Number of Children: N/A  . Years of Education: N/A   Occupational History  . Not on file.   Social History Main Topics  . Smoking status: Never Smoker    . Smokeless tobacco: Never Used  . Alcohol Use: No  . Drug Use: No  . Sexual Activity: Not Currently   Other Topics Concern  . Not on file   Social History Narrative     Current outpatient prescriptions:  .  buPROPion (WELLBUTRIN XL) 150 MG 24 hr tablet, Take 1 tablet (150 mg total) by mouth daily., Disp: 30 tablet, Rfl: 2 .  Cholecalciferol (VITAMIN D) 2000 UNITS CAPS, Take 1 tablet by mouth daily., Disp: , Rfl:  .  ibuprofen (ADVIL,MOTRIN) 800 MG tablet, Take 1 tablet by mouth as needed., Disp: , Rfl: 0 .  levonorgestrel (MIRENA, 52 MG,) 20 MCG/24HR IUD, by Intrauterine route., Disp: , Rfl:  .  metaxalone (SKELAXIN) 800 MG tablet, Take 1 tablet (800 mg total) by mouth 3 (three) times daily., Disp: 90 tablet, Rfl: 0 .  QUEtiapine (SEROQUEL) 100 MG tablet, Take 1 tablet (100 mg total) by mouth at bedtime., Disp: 30 tablet, Rfl: 1 .  sertraline (ZOLOFT) 50 MG tablet, Take 1 tablet (50 mg total) by mouth daily., Disp: 30 tablet, Rfl: 0 .  traZODone (DESYREL) 50 MG tablet, Take 1 tablet (50 mg total) by mouth at bedtime as needed for sleep., Disp: 30 tablet, Rfl: 0  No Known Allergies   ROS  Constitutional: Negative for fever , positive for weight change - gained 6 lbs since last visit.  Respiratory: Negative for cough and shortness of breath.  Cardiovascular: Negative for chest pain or palpitations.  Gastrointestinal: Negative for abdominal pain, no bowel changes.  Musculoskeletal: Negative for gait problem or joint swelling.  Skin: Negative for rash.  Neurological: Negative for dizziness or headache.  No other specific complaints in a complete review of systems (except as listed in HPI above).  Objective  Filed Vitals:   10/13/14 1346  BP: 126/84  Pulse: 85  Temp: 98.6 F (37 C)  TempSrc: Oral  Resp: 14  Height: 5\' 5"  (1.651 m)  Weight: 279 lb 9.6 oz (126.826 kg)  SpO2: 98%    Body mass index is 46.53 kg/(m^2).  Physical Exam  Constitutional: Patient appears  well-developed and well-nourished. Obese No distress.  HEENT: head atraumatic, normocephalic, pupils equal and reactive to light,  neck supple, throat within normal limits Cardiovascular: Normal rate, regular rhythm and normal heart sounds.  No murmur heard. No BLE edema. Pulmonary/Chest: Effort normal and breath sounds normal. No respiratory distress. Abdominal: Soft.  There is no tenderness. Psychiatric: Patient has a normal mood and affect. behavior is normal. Judgment and thought content normal.   PHQ2/9: Depression screen PHQ 2/9 08/25/2014  Decreased Interest 3  Down, Depressed, Hopeless 3  PHQ - 2 Score 6  Altered sleeping 0  Tired, decreased energy 3  Change in appetite 2  Feeling bad or failure about yourself  3  Trouble concentrating 3  Moving slowly or fidgety/restless 3  Suicidal thoughts 0  PHQ-9 Score 20  Difficult doing work/chores Very difficult   Seeing psychiatrist now, Dr. Gardiner Barefoot  Fall Risk: Fall Risk  08/25/2014  Falls in the past year? Yes  Number falls in past yr: 1  Injury with Fall? No     Assessment & Plan  1. Chronic recurrent major depressive disorder Advised to take Seroquel around 9 pm , instead of 10pm to see if she feels less sluggish when she wakes up, will start seeing therapist next week.   2. Insomnia, persistent Sleeping well, but feeling tired when she wakes up  3. Extreme obesity Gained 6 lbs since last visit, it may be secondary to Seroquel, but advised to try walking 15 - 30 minutes daily in the sunlight, to help with her weight and her mood also. Continue just drinking water. Currently only eating one meal a day, usually dinner. Explained the importance of three meals daily , not to skip breakfast  4. Needs flu shot Refuses vaccine

## 2014-10-20 ENCOUNTER — Ambulatory Visit: Payer: Self-pay | Admitting: Psychiatry

## 2015-06-15 ENCOUNTER — Encounter: Payer: BLUE CROSS/BLUE SHIELD | Admitting: Family Medicine

## 2015-06-29 ENCOUNTER — Ambulatory Visit (INDEPENDENT_AMBULATORY_CARE_PROVIDER_SITE_OTHER): Payer: BLUE CROSS/BLUE SHIELD | Admitting: Family Medicine

## 2015-06-29 ENCOUNTER — Encounter: Payer: Self-pay | Admitting: Family Medicine

## 2015-06-29 VITALS — BP 122/76 | HR 72 | Temp 99.1°F | Resp 16 | Wt 274.2 lb

## 2015-06-29 DIAGNOSIS — Z13220 Encounter for screening for lipoid disorders: Secondary | ICD-10-CM

## 2015-06-29 DIAGNOSIS — M545 Low back pain, unspecified: Secondary | ICD-10-CM

## 2015-06-29 DIAGNOSIS — Z124 Encounter for screening for malignant neoplasm of cervix: Secondary | ICD-10-CM

## 2015-06-29 DIAGNOSIS — F339 Major depressive disorder, recurrent, unspecified: Secondary | ICD-10-CM

## 2015-06-29 DIAGNOSIS — Z0000 Encounter for general adult medical examination without abnormal findings: Secondary | ICD-10-CM

## 2015-06-29 DIAGNOSIS — K219 Gastro-esophageal reflux disease without esophagitis: Secondary | ICD-10-CM | POA: Diagnosis not present

## 2015-06-29 DIAGNOSIS — R1310 Dysphagia, unspecified: Secondary | ICD-10-CM | POA: Diagnosis not present

## 2015-06-29 DIAGNOSIS — Z114 Encounter for screening for human immunodeficiency virus [HIV]: Secondary | ICD-10-CM

## 2015-06-29 DIAGNOSIS — G4700 Insomnia, unspecified: Secondary | ICD-10-CM

## 2015-06-29 DIAGNOSIS — Z131 Encounter for screening for diabetes mellitus: Secondary | ICD-10-CM | POA: Diagnosis not present

## 2015-06-29 DIAGNOSIS — R5383 Other fatigue: Secondary | ICD-10-CM

## 2015-06-29 DIAGNOSIS — Z79899 Other long term (current) drug therapy: Secondary | ICD-10-CM | POA: Diagnosis not present

## 2015-06-29 DIAGNOSIS — R1013 Epigastric pain: Secondary | ICD-10-CM | POA: Diagnosis not present

## 2015-06-29 DIAGNOSIS — Z62810 Personal history of physical and sexual abuse in childhood: Secondary | ICD-10-CM | POA: Insufficient documentation

## 2015-06-29 DIAGNOSIS — R110 Nausea: Secondary | ICD-10-CM | POA: Diagnosis not present

## 2015-06-29 DIAGNOSIS — Z01419 Encounter for gynecological examination (general) (routine) without abnormal findings: Secondary | ICD-10-CM

## 2015-06-29 MED ORDER — SERTRALINE HCL 50 MG PO TABS: 50.0000 mg | ORAL_TABLET | Freq: Every day | ORAL | Status: AC

## 2015-06-29 MED ORDER — OMEPRAZOLE 40 MG PO CPDR: 40.0000 mg | DELAYED_RELEASE_CAPSULE | Freq: Every day | ORAL | Status: AC

## 2015-06-29 MED ORDER — METAXALONE 800 MG PO TABS: 800.0000 mg | ORAL_TABLET | Freq: Three times a day (TID) | ORAL | Status: AC

## 2015-06-29 MED ORDER — QUETIAPINE FUMARATE 100 MG PO TABS: 100.0000 mg | ORAL_TABLET | Freq: Every day | ORAL | Status: AC

## 2015-06-29 NOTE — Progress Notes (Signed)
Name: Kaitlyn Greene   MRN: [id number]    DOB: Jun 04, 1980   Date:06/29/2015       Progress Note  Subjective  Chief Complaint  Chief Complaint  Patient presents with  . Annual Exam  . Medication Refill  . Nausea    patient has been nauseated for the past couple of days  . Gastroesophageal Reflux    patient stated that it has gotten worse.    HPI  Well Woman: she has an IUD, denies vaginal discharge or breast lumps  GERD with dysphagia: she has a long history of GERD, but symptoms are getting worse with nausea, recurrent vomiting and also dysphagia and odynophagia in the middle of esophagus. Even liquids bothers her at time. Taking otc omeprazole without help.   Chronic back pain; she has been out of Skelaxin, she has daily low back pain, pain is described as aching and sharp, no symptoms of radiculitis. She has had PT, seen by chiropractor without improvement of symptoms. Skelaxin seems to help , but Ibuprofen does not work and pain is constant. Pain level at this time is 7/10. Pain is worse with movement - such as walking, better is lateral decubitus.   Obesity: she has lost 6 lbs since last visit, she states she is not sure why, no change in diet or activity level  Chronic Depression: she was seen by Dr. Carlis Abbott last year, she was on Seroquel, Zoloft, Wellbutrin and Trazodone. She noticed improvement on Seroquel and Zoloft but not the other medications. She stopped going because the co-pays were too high. She has anhedonia, recurrence of crying spells, fatigue, no change of appetite. She has lack of focus.    Patient Active Problem List   Diagnosis Date Noted  . Back pain, chronic 08/22/2014  . Insomnia, persistent 08/22/2014  . CN (constipation) 08/22/2014  . Gastro-esophageal reflux disease without esophagitis 08/22/2014  . Herpes simplex type 2 infection 08/22/2014  . Presence of intrauterine contraceptive device 08/22/2014  . Chronic recurrent major depressive disorder (HCC)  08/22/2014  . Extreme obesity (HCC) 08/22/2014  . Knee pain 08/22/2014  . Allergic rhinitis 08/22/2014  . Hematuria 12/13/2009    History reviewed. No pertinent past surgical history.  Family History  Problem Relation Age of Onset  . Hypertension Mother   . Hyperlipidemia Mother   . COPD Mother   . Depression Mother   . Diabetes Father   . COPD Father   . Depression Sister   . Hypertension Brother     Social History   Social History  . Marital Status: Single    Spouse Name: N/A  . Number of Children: N/A  . Years of Education: N/A   Occupational History  . Not on file.   Social History Main Topics  . Smoking status: Never Smoker   . Smokeless tobacco: Never Used  . Alcohol Use: No  . Drug Use: No  . Sexual Activity: Not Currently   Other Topics Concern  . Not on file   Social History Narrative     Current outpatient prescriptions:  .  Cholecalciferol (VITAMIN D) 2000 UNITS CAPS, Take 1 tablet by mouth daily., Disp: , Rfl:  .  ibuprofen (ADVIL,MOTRIN) 800 MG tablet, Take 1 tablet by mouth as needed., Disp: , Rfl: 0 .  levonorgestrel (MIRENA, 52 MG,) 20 MCG/24HR IUD, by Intrauterine route., Disp: , Rfl:  .  metaxalone (SKELAXIN) 800 MG tablet, Take 1 tablet (800 mg total) by mouth 3 (three) times daily., Disp: 90  tablet, Rfl: 0 .  QUEtiapine (SEROQUEL) 100 MG tablet, Take 1 tablet (100 mg total) by mouth at bedtime., Disp: 30 tablet, Rfl: 2 .  sertraline (ZOLOFT) 50 MG tablet, Take 1 tablet (50 mg total) by mouth daily., Disp: 30 tablet, Rfl: 2 .  omeprazole (PRILOSEC) 40 MG capsule, Take 1 capsule (40 mg total) by mouth daily., Disp: 30 capsule, Rfl: 2 .  XIIDRA 5 % SOLN, Reported on 06/29/2015, Disp: , Rfl: 3  No Known Allergies   ROS  Constitutional: Negative for fever , positive for mild  weight change.  Respiratory: Negative for cough and shortness of breath.   Cardiovascular: Negative for chest pain or palpitations.  Gastrointestinal: Negative for  abdominal pain, no bowel changes.  Musculoskeletal: Negative for gait problem , at times has  joint swelling ( knee ).  Skin: Negative for rash.  Neurological: Negative for dizziness or headache.  No other specific complaints in a complete review of systems (except as listed in HPI above).  Objective  Filed Vitals:   06/29/15 1119  BP: 122/76  Pulse: 72  Temp: 99.1 F (37.3 C)  TempSrc: Oral  Resp: 16  Weight: 274 lb 3.2 oz (124.376 kg)    Body mass index is 45.63 kg/(m^2).  Physical Exam  Constitutional: Patient appears well-developed and morbidly  obese No distress.  HENT: Head: Normocephalic and atraumatic. Ears: B TMs ok, no erythema or effusion; Nose: Nose normal. Mouth/Throat: Oropharynx is clear and moist. No oropharyngeal exudate.  Eyes: Conjunctivae and EOM are normal. Pupils are equal, round, and reactive to light. No scleral icterus.  Neck: Normal range of motion. Neck supple. No JVD present. No thyromegaly present.  Cardiovascular: Normal rate, regular rhythm and normal heart sounds.  No murmur heard. No BLE edema. Pulmonary/Chest: Effort normal and breath sounds normal. No respiratory distress. Abdominal: Soft. Bowel sounds are normal, no distension. There is  Tenderness epigastric pain . no masses Breast: no lumps or masses, no nipple discharge or rashes FEMALE GENITALIA:  External genitalia normal External urethra normal Vaginal vault normal without discharge or lesions Cervix normal without discharge or lesions, IUD strings is in place Bimanual exam normal without masses RECTAL: not done Musculoskeletal: Normal range of motion, no joint effusions. No gross deformities. Pain during palpation of lumbar spine, negative straight leg raise Neurological: he is alert and oriented to person, place, and time. No cranial nerve deficit. Coordination, balance, strength, speech and gait are normal.  Skin: Skin is warm and dry. No rash noted. No erythema.  Psychiatric:  Patient has a normal mood and affect. behavior is normal. Judgment and thought content normal.   PHQ2/9: Depression screen [facility] 2/9 06/29/2015 08/25/2014  Decreased Interest 0 3  Down, Depressed, Hopeless 3 3  PHQ - 2 Score 3 6  Altered sleeping 3 0  Tired, decreased energy 2 3  Change in appetite 0 2  Feeling bad or failure about yourself  3 3  Trouble concentrating 3 3  Moving slowly or fidgety/restless 0 3  Suicidal thoughts 0 0  PHQ-9 Score 14 20  Difficult doing work/chores Very difficult Very difficult     Fall Risk: Fall Risk  06/29/2015 08/25/2014  Falls in the past year? No Yes  Number falls in past yr: - 1  Injury with Fall? - No     Functional Status Survey: Is the patient deaf or have difficulty hearing?: No Does the patient have difficulty seeing, even when wearing glasses/contacts?: No Does the patient have difficulty  concentrating, remembering, or making decisions?: Yes Does the patient have difficulty walking or climbing stairs?: No Does the patient have difficulty dressing or bathing?: No Does the patient have difficulty doing errands alone such as visiting a doctor's office or shopping?: No    Assessment & Plan  1. Well woman exam  Discussed importance of 150 minutes of physical activity weekly, eat two servings of fish weekly, eat one serving of tree nuts ( cashews, pistachios, pecans, almonds.Marland Kitchen.) every other day, eat 6 servings of fruit/vegetables daily and drink plenty of water and avoid sweet beverages.   2. Lipid screening  - Lipid panel  3. Bilateral low back pain without sciatica  - metaxalone (SKELAXIN) 800 MG tablet; Take 1 tablet (800 mg total) by mouth 3 (three) times daily.  Dispense: 90 tablet; Refill: 0 - Ambulatory referral to Pain Clinic  4. Morbid obesity, unspecified obesity type [facility](HCC)  Discussed with the patient the risk posed by an increased BMI. Discussed importance of portion control, calorie counting and at least 150 minutes of  physical activity weekly. Avoid sweet beverages and drink more water. Eat at least 6 servings of fruit and vegetables daily   5. Chronic recurrent major depressive disorder (HCC)  - QUEtiapine (SEROQUEL) 100 MG tablet; Take 1 tablet (100 mg total) by mouth at bedtime.  Dispense: 30 tablet; Refill: 2 - sertraline (ZOLOFT) 50 MG tablet; Take 1 tablet (50 mg total) by mouth daily.  Dispense: 30 tablet; Refill: 2  6. Diabetes mellitus screening  - Hemoglobin A1c  7. Insomnia, persistent  - QUEtiapine (SEROQUEL) 100 MG tablet; Take 1 tablet (100 mg total) by mouth at bedtime.  Dispense: 30 tablet; Refill: 2  8. Nausea  - H. pylori antibody, IgG - Ambulatory referral to Gastroenterology  9. Gastro-esophageal reflux disease without esophagitis  - omeprazole (PRILOSEC) 40 MG capsule; Take 1 capsule (40 mg total) by mouth daily.  Dispense: 30 capsule; Refill: 2  10. Cervical cancer screening  - Pap IG, CT/NG NAA, and HPV (high risk)  11. Dysphagia  - CBC with Differential/Platelet - H. pylori antibody, IgG  12. Long-term use of high-risk medication  - Comprehensive metabolic panel - Hemoglobin A1c  13. Encounter for screening for HIV  - HIV antibody  14. Other fatigue  - VITAMIN D 25 Hydroxy (Vit-D Deficiency, Fractures) - Vitamin B12 - TSH  15. Dyspepsia  - H. pylori antibody, IgG

## 2015-07-04 LAB — PAP IG, CT-NG NAA, HPV HIGH-RISK
CHLAMYDIA, NUC. ACID AMP: NEGATIVE
GONOCOCCUS BY NUCLEIC ACID AMP: NEGATIVE
HPV, high-risk: NEGATIVE
PAP Smear Comment: 0

## 2015-07-05 LAB — VITAMIN D 25 HYDROXY (VIT D DEFICIENCY, FRACTURES): Vit D, 25-Hydroxy: 28.4 ng/mL — ABNORMAL LOW (ref 30.0–100.0)

## 2015-07-05 LAB — HIV ANTIBODY (ROUTINE TESTING W REFLEX): HIV Screen 4th Generation wRfx: NONREACTIVE

## 2015-07-05 LAB — CBC WITH DIFFERENTIAL/PLATELET
BASOS ABS: 0 10*3/uL (ref 0.0–0.2)
Basos: 0 %
EOS (ABSOLUTE): 0.2 10*3/uL (ref 0.0–0.4)
Eos: 5 %
Hematocrit: 39.3 % (ref 34.0–46.6)
Hemoglobin: 12.6 g/dL (ref 11.1–15.9)
IMMATURE GRANS (ABS): 0 10*3/uL (ref 0.0–0.1)
IMMATURE GRANULOCYTES: 0 %
Lymphocytes Absolute: 2.1 10*3/uL (ref 0.7–3.1)
Lymphs: 43 %
MCH: 30.5 pg (ref 26.6–33.0)
MCHC: 32.1 g/dL (ref 31.5–35.7)
MCV: 95 fL (ref 79–97)
MONOS ABS: 0.3 10*3/uL (ref 0.1–0.9)
Monocytes: 6 %
NEUTROS PCT: 46 %
Neutrophils Absolute: 2.2 10*3/uL (ref 1.4–7.0)
PLATELETS: 374 10*3/uL (ref 150–379)
RBC: 4.13 x10E6/uL (ref 3.77–5.28)
RDW: 13.3 % (ref 12.3–15.4)
WBC: 4.8 10*3/uL (ref 3.4–10.8)

## 2015-07-05 LAB — COMPREHENSIVE METABOLIC PANEL
ALT: 18 IU/L (ref 0–32)
AST: 13 IU/L (ref 0–40)
Albumin/Globulin Ratio: 1.4 (ref 1.2–2.2)
Albumin: 3.9 g/dL (ref 3.5–5.5)
Alkaline Phosphatase: 66 IU/L (ref 39–117)
BILIRUBIN TOTAL: 0.4 mg/dL (ref 0.0–1.2)
BUN/Creatinine Ratio: 10 (ref 9–23)
BUN: 9 mg/dL (ref 6–20)
CALCIUM: 8.9 mg/dL (ref 8.7–10.2)
CHLORIDE: 106 mmol/L (ref 96–106)
CO2: 23 mmol/L (ref 18–29)
Creatinine, Ser: 0.92 mg/dL (ref 0.57–1.00)
GFR calc non Af Amer: 80 mL/min/{1.73_m2} (ref >59)
GFR, EST AFRICAN AMERICAN: 93 mL/min/{1.73_m2} (ref >59)
Globulin, Total: 2.8 g/dL (ref 1.5–4.5)
Glucose: 86 mg/dL (ref 65–99)
POTASSIUM: 4.1 mmol/L (ref 3.5–5.2)
Sodium: 145 mmol/L — ABNORMAL HIGH (ref 134–144)
TOTAL PROTEIN: 6.7 g/dL (ref 6.0–8.5)

## 2015-07-05 LAB — H. PYLORI ANTIBODY, IGG

## 2015-07-05 LAB — HEMOGLOBIN A1C
ESTIMATED AVERAGE GLUCOSE: 103 mg/dL
Hgb A1c MFr Bld: 5.2 % (ref 4.8–5.6)

## 2015-07-05 LAB — LIPID PANEL
CHOLESTEROL TOTAL: 157 mg/dL (ref 100–199)
Chol/HDL Ratio: 3.7 ratio units (ref 0.0–4.4)
HDL: 43 mg/dL (ref >39)
LDL CALC: 104 mg/dL — AB (ref 0–99)
TRIGLYCERIDES: 49 mg/dL (ref 0–149)
VLDL Cholesterol Cal: 10 mg/dL (ref 5–40)

## 2015-07-05 LAB — TSH: TSH: 1.3 u[IU]/mL (ref 0.450–4.500)

## 2015-07-05 LAB — VITAMIN B12: VITAMIN B 12: 453 pg/mL (ref 211–946)

## 2015-10-05 ENCOUNTER — Ambulatory Visit: Payer: Self-pay | Admitting: Family Medicine

## 2015-11-16 ENCOUNTER — Ambulatory Visit: Payer: Self-pay | Admitting: Family Medicine

## 2016-04-23 ENCOUNTER — Other Ambulatory Visit: Payer: Self-pay | Admitting: Family Medicine

## 2016-04-23 MED ORDER — LIDOCAINE 5 % EX OINT: 1.0000 "application " | TOPICAL_OINTMENT | Freq: Three times a day (TID) | CUTANEOUS | 0 refills | Status: DC | PRN

## 2016-04-23 NOTE — Progress Notes (Unsigned)
lidocain

## 2016-04-30 ENCOUNTER — Other Ambulatory Visit: Payer: Self-pay

## 2016-04-30 MED ORDER — DICLOFENAC SODIUM 3 % TD GEL: 2.0000 g | Freq: Four times a day (QID) | TRANSDERMAL | 1 refills | Status: AC | PRN

## 2016-04-30 MED ORDER — LIDOCAINE 5 % EX OINT: 2.0000 "application " | TOPICAL_OINTMENT | Freq: Three times a day (TID) | CUTANEOUS | 1 refills | Status: AC | PRN

## 2017-06-02 DEATH — deceased

## 2017-07-02 DEATH — deceased
